# Patient Record
Sex: Male | Born: 1955 | Race: Black or African American | Hispanic: No | Marital: Single | Smoking: Current every day smoker
Health system: Southern US, Community
[De-identification: ages and names within clinical notes are randomized; demographics above are authoritative.]

## PROBLEM LIST (undated history)

## (undated) DIAGNOSIS — I10 Essential (primary) hypertension: Secondary | ICD-10-CM

## (undated) HISTORY — DX: Essential (primary) hypertension: I10

---

## 2012-01-29 ENCOUNTER — Emergency Department: Payer: Self-pay | Admitting: Internal Medicine

## 2012-01-29 LAB — BASIC METABOLIC PANEL
Anion Gap: 8 (ref 7–16)
BUN: 6 mg/dL — ABNORMAL LOW (ref 7–18)
Calcium, Total: 9.4 mg/dL (ref 8.5–10.1)
Chloride: 106 mmol/L (ref 98–107)
Co2: 26 mmol/L (ref 21–32)
Glucose: 88 mg/dL (ref 65–99)
Osmolality: 276 (ref 275–301)
Potassium: 3.5 mmol/L (ref 3.5–5.1)
Sodium: 140 mmol/L (ref 136–145)

## 2012-01-29 LAB — CBC WITH DIFFERENTIAL/PLATELET
Basophil #: 0 10*3/uL (ref 0.0–0.1)
Basophil %: 0.2 %
Eosinophil %: 0.2 %
HCT: 49.8 % (ref 40.0–52.0)
Lymphocyte %: 9.9 %
MCH: 30.5 pg (ref 26.0–34.0)
MCV: 88 fL (ref 80–100)
Monocyte #: 1.4 x10 3/mm — ABNORMAL HIGH (ref 0.2–1.0)
Monocyte %: 9.8 %
Neutrophil #: 11.2 10*3/uL — ABNORMAL HIGH (ref 1.4–6.5)
Neutrophil %: 79.9 %
Platelet: 220 10*3/uL (ref 150–440)
RBC: 5.68 10*6/uL (ref 4.40–5.90)
RDW: 13.4 % (ref 11.5–14.5)
WBC: 14 10*3/uL — ABNORMAL HIGH (ref 3.8–10.6)

## 2013-01-01 IMAGING — CT CT NECK WITH CONTRAST
1 of 2 series · 9 of 14 positions shown, 12 images · IV contrast (agent unspecified)
Comparison: none

REASON FOR EXAM: dysphagia, pain
COMMENTS:

PROCEDURE:     CT  - CT NECK WITH CONTRAST  - January 29, 2012  [DATE]
RESULT:     Comparison: None.
TECHNIQUE: Multiple axial images were obtained of the neck from the frontal
sinuses through to the aortic arch after the administration of 75 mL
[id number] intravenous contrast.

[Series 2: soft tissue · axial · 0.47mm/px · z∈[+156,+408]mm · 9 of 106 slices shown, 12 images]
[im 11/106  soft-tissue]
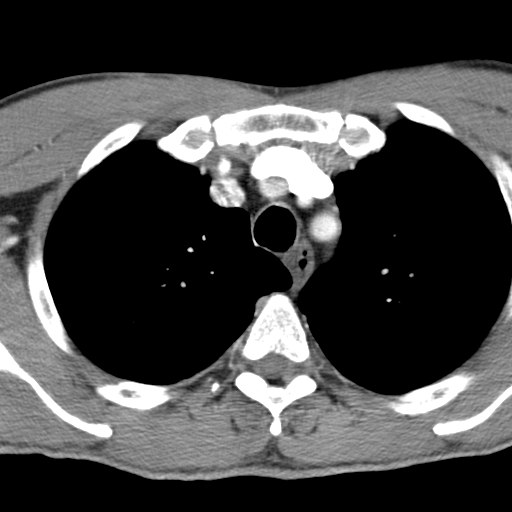
[im 11/106  bone]
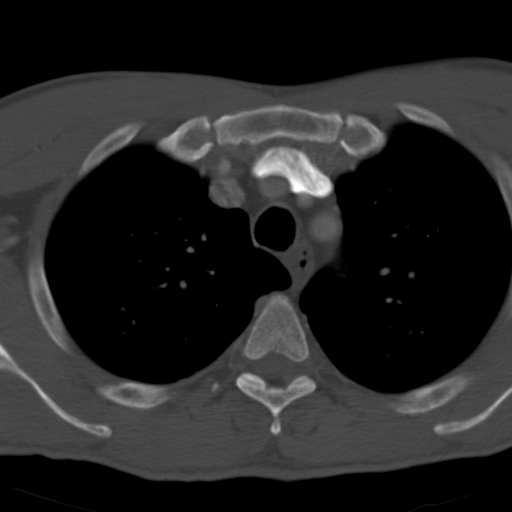
[im 22/106  bone]
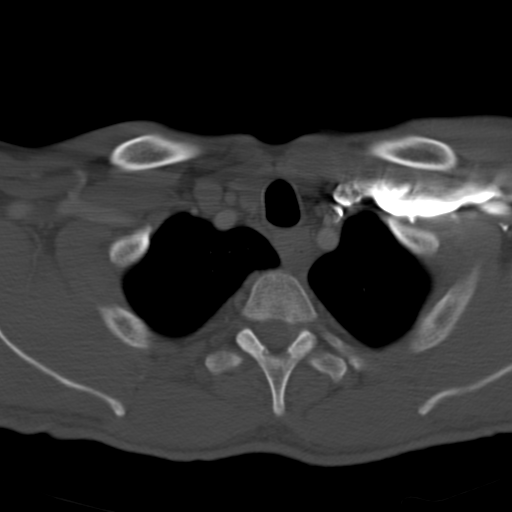
[im 32/106  bone]
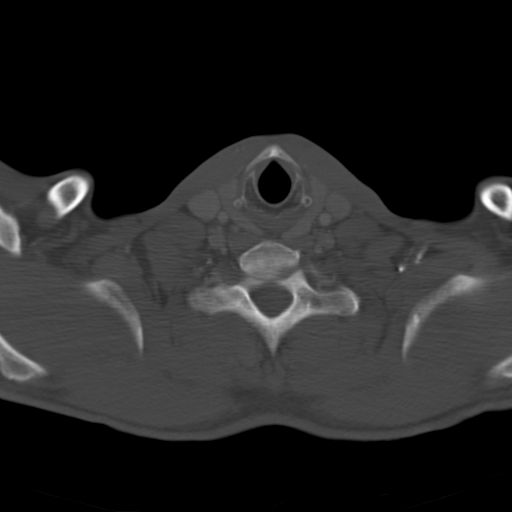
[im 43/106  bone]
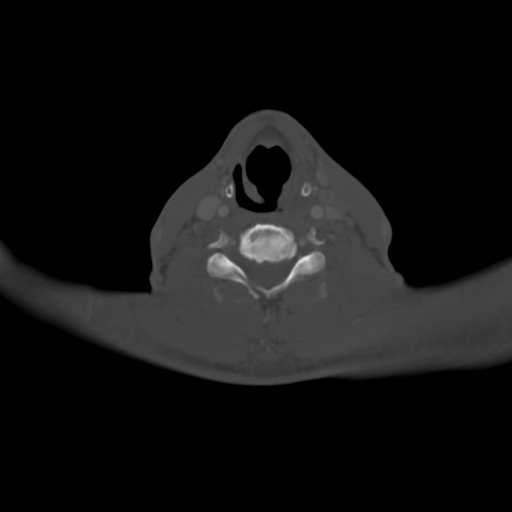
[im 53/106  soft-tissue]
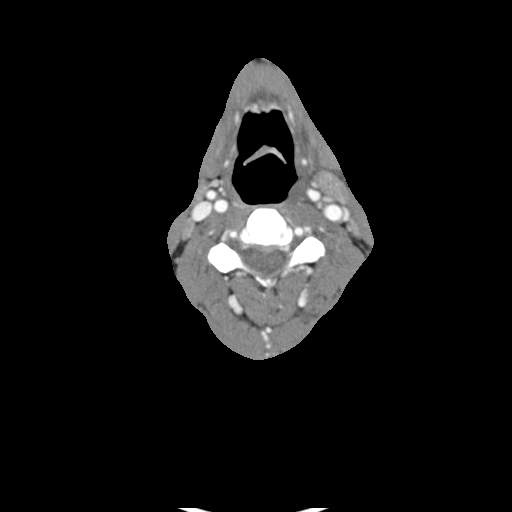
[im 53/106  bone]
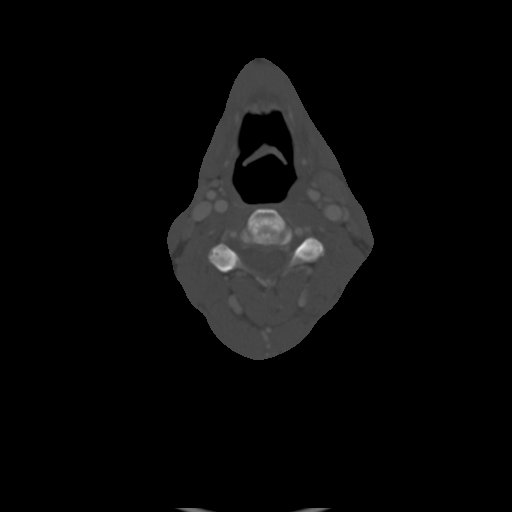
[im 64/106  bone]
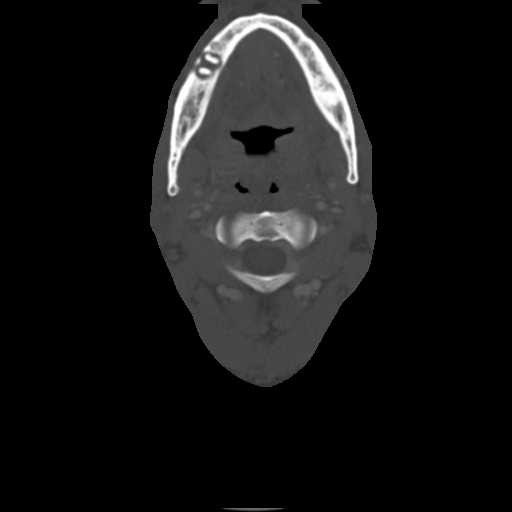
[im 74/106  bone]
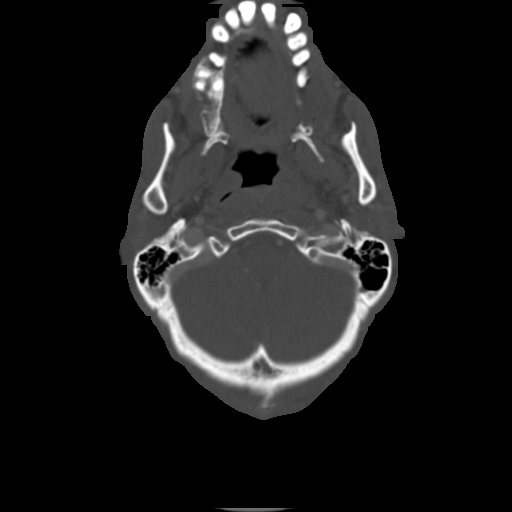
[im 85/106  bone]
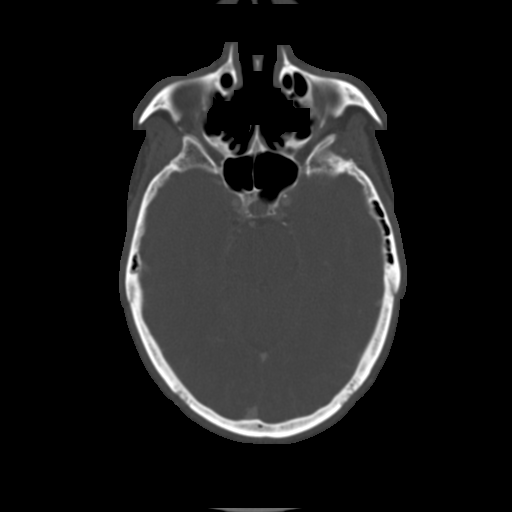
[im 95/106  soft-tissue]
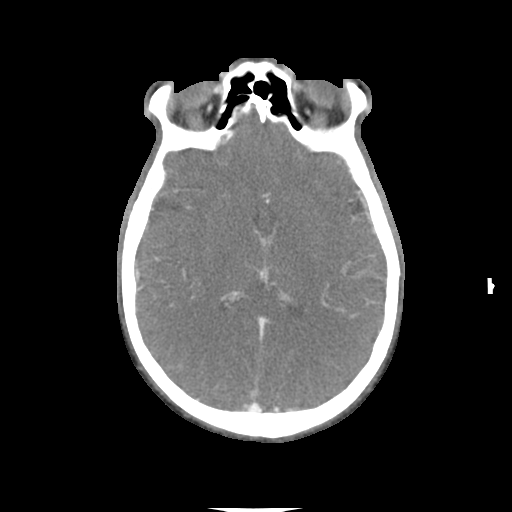
[im 95/106  bone]
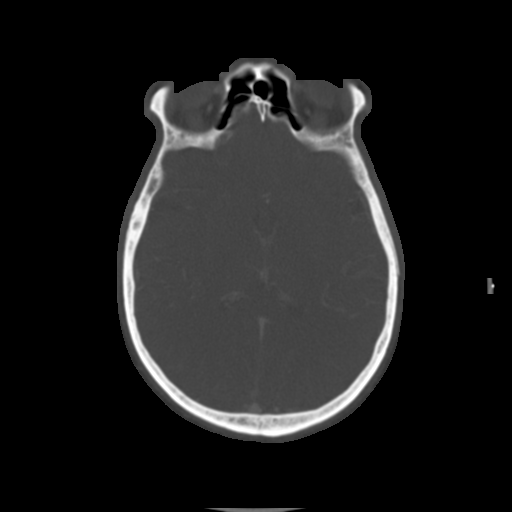

[9 of 14 positions shown; findings below may reference images not displayed]

FINDINGS: There is enlargement of the left tonsillar pillar with ill-defined areas of
low-attenuation within the tonsillar pillar. Exact measurement of the area
of low-attenuation is difficult to determine, but measures approximately
x 1.0 cm. There is a nodular enhancing soft tissue along the base of the
tongue both at midline as well as to the right and left of midline. This is
of uncertain etiology, and malignancy cannot be excluded. There is an
enlarged left carotid chain lymph node at the level of the submandibular
gland which measures 1.8 x 1.1 cm. There are several other borderline
enlarged carotid chain lymph nodes, bilaterally. A right level 2 carotid
chain lymph node measures 1.2 x 1.0 cm. There is mild thickening of the left
aryepiglottic folds, which is nonspecific. The airway is patent.

There is mild circumferential thickening of the visualized upper thoracic
esophagus, which is nonspecific. There is periapical lucency surrounding
multiple teeth in the maxilla as well as the right mandible, likely
secondary to periodontal disease.

No aggressive lytic or sclerotic osseous lesions are identified.
IMPRESSION: 1. Enlarged and heterogeneous left tonsillar pillar is nonspecific and may
be infectious. There is ill-defined area of low-attenuation within the
tonsillar pillar which may represent phlegmonous change versus early
abscess. While these findings may be infectious, malignancy cannot be
excluded. Clinical correlation and direct visualization are suggested.
2. Nodular enhancing soft tissue at the base of the tongue is nonspecific.
Malignancy malignancy cannot be excluded. Direct visualization is
recommended.
3. Mildly enlarged bilateral carotid chain lymph nodes, which are
nonspecific.
4. Mild thickening of the left aryepiglottic folds, which is not
significantly be infectious or inflammatory. Recommend attention on the
above direct visualization.
5. Mild circumferential thickening of the superior thoracic esophagus is
nonspecific and may be related to esophagitis. Further evaluation could be
provided with direct visualization as indicated.

The preliminary report was provided to the emergency department shortly
after the procedure. The final report was called to Dr. Purna Hoops at 1671
hours 01/30/2012.

## 2015-01-11 NOTE — Consult Note (Signed)
PATIENT NAME:  Rodney English, Rodney English MR#:  [id number] DATE OF BIRTH:  09-Apr-1956  DATE OF CONSULTATION:  01/29/2012  REFERRING PHYSICIAN:   CONSULTING PHYSICIAN:  Lubertha Leite A. Allena KatzPatel, MD  PRIMARY CARE PHYSICIAN: None.  CHIEF COMPLAINT: Left neck pain for three days.   HISTORY OF PRESENT ILLNESS: Mr. Rodney English is a pleasant 59 year old African American gentleman with no significant past medical history who comes to the emergency room with complaints of left neck pain for the past three days. He started having some symptoms on Thursday, got worse, and had his last meal on Saturday night and had pizza which he was  able to swallow it. This morning he woke up and was having a little bit more difficulty swallowing and came to the emergency room and was found to have left-sided tonsillitis. CT of the neck was done which showed mild to moderate enlargement of the left tonsillar pillar of the tonsil with possible early abscess. The patient in the emergency room is hemodynamically stable. He is afebrile. He is not in any respiratory distress. He did have some odynophagia, however, he was able to tolerate liquids and did not have any symptoms of respiratory distress. His white count is elevated to 14,000. He is not having any fever. The patient did drink some liquids in front of me and did not have any trouble swallowing. He received a dose of Rocephin, Unasyn, and 10 mg of IV Decadron in the emergency room. He also received one dose of Toradol. The patient feels much better than when he came in.  PAST MEDICAL HISTORY: None.   PAST SURGICAL HISTORY: None.   MEDICATIONS: None.   ALLERGIES: No known drug allergies.   SOCIAL HISTORY: He drinks 1 or 2 beers every day. He denies any alcohol related problems. He smokes a pack a day.   REVIEW OF SYSTEMS: CONSTITUTIONAL: No fever, fatigue, or weakness. EYES: No blurred or double vision. ENT: Positive for odynophagia and left neck pain. RESPIRATORY: No cough, wheeze, or  hemoptysis. CARDIOVASCULAR: No chest pain, orthopnea, or edema. GASTROINTESTINAL: No nausea, vomiting, diarrhea, or abdominal pain. GU: No dysuria or hematuria. ENDOCRINE: No polyuria or nocturia. HEMATOLOGY: No anemia or easy bruising. SKIN: No acne or rash. MUSCULOSKELETAL: Positive for arthritis. NEUROLOGIC: No cerebrovascular accident or transient ischemic attack. PSYCH: No anxiety or depression. All other systems reviewed are negative.   PHYSICAL EXAMINATION:   GENERAL: The patient is awake, alert, and oriented x3, not in acute distress.   VITAL SIGNS: He is afebrile. Pulse is 67 and blood pressure 117/64.  Saturations are 100% on room air.  HEENT: Atraumatic, normocephalic. Pupils are equal, round, and reactive to light and accommodation. Extraocular movements intact. Oral mucosa is moist. Oropharynx - the patient does have erythema on the left tonsillar area with uvula visible. No swelling or erythema of the uvula and no swelling or erythema of the right tonsillar area. I could not see any pus pockets or any exudative spots on the left tonsil. I could not feel any anterior cervical chain lymph nodes at present.   RESPIRATORY: Clear to auscultation bilaterally. No rales, rhonchi, respiratory distress, or labored breathing.   HEART: Both heart sounds are normal. Rate and rhythm is regular. PMI is not lateralized. Chest is nontender.   EXTREMITIES: Good pedal pulses. Good femoral pulses. No lower extremity edema.   ABDOMEN: Soft, benign, and nontender. No organomegaly. Positive bowel sounds.   NEURO: Cranial nerves II through XII grossly intact. No motor or sensory deficits.  PSYCH: The patient is awake, alert, and oriented x3.   LABS/STUDIES: White count 14.0 and hemoglobin and hematocrit is within normal limits. Basic metabolic panel is within normal limits.   CT of the neck shows mild to moderate enlargement of the left tonsillar pillar with submucosal 8 mm hypo-enhancing region  suggestive of possibly early abscess. No evidence of epiglottitis.   ASSESSMENT: 59 year old Rodney English with: 1. Left side tonsillitis. Symptoms started on Thursday. The patient is able to swallow liquids without any difficulty. No respiratory distress. Saturations are 100% on room air. CT of the neck is suggestive of mild to moderate inflammation of the left tonsil with 8 mm hypo-enhancing area suggestive of early abscess. No epiglottitis.  2. Odynophagia due to tonsillitis.  3. Tobacco abuse.   PLAN: The plan was discussed with Dr. Gertie Baron. The area appears intact, able to swallow liquids, no respiratory distress. He feels better than with what he came in with. He received a dose of antibiotic and Decadron along with pain medication. Okay to go home. The patient will be discharged to go home, have a close follow-up with Dr. Gertie Baron. The patient was given the telephone number to make an appointment in the morning. He is recommended to continue p.o. Augmentin and Decadron. The patient is asked to take plenty of liquids and a soft diet. He is also recommended to come to the emergency room if no improvement. The patient is agreeable. The discharge plan was discussed with the patient. No family members present. The above was also discussed with Dr. Gertie Baron who is agreeable.   TIME SPENT: 45 minutes  ____________________________ Jearl Klinefelter A. Allena Katz, MD sap:slb D: 01/29/2012 23:21:49 ET T: 01/30/2012 09:34:08 ET JOB#: [id number]  cc: Herley Bernardini A. Allena Katz, MD, <Dictator> Willow Ora MD ELECTRONICALLY SIGNED 01/30/2012 11:03

## 2022-11-11 ENCOUNTER — Other Ambulatory Visit: Payer: Self-pay | Admitting: Internal Medicine

## 2022-11-11 DIAGNOSIS — F17210 Nicotine dependence, cigarettes, uncomplicated: Secondary | ICD-10-CM

## 2022-11-11 DIAGNOSIS — Z136 Encounter for screening for cardiovascular disorders: Secondary | ICD-10-CM

## 2022-11-16 ENCOUNTER — Other Ambulatory Visit: Payer: Self-pay | Admitting: Internal Medicine

## 2022-11-16 DIAGNOSIS — Z136 Encounter for screening for cardiovascular disorders: Secondary | ICD-10-CM

## 2022-11-16 DIAGNOSIS — F17210 Nicotine dependence, cigarettes, uncomplicated: Secondary | ICD-10-CM

## 2022-12-13 ENCOUNTER — Ambulatory Visit: Payer: Medicare HMO

## 2022-12-23 ENCOUNTER — Ambulatory Visit: Payer: Medicaid Other

## 2023-03-06 ENCOUNTER — Other Ambulatory Visit: Payer: Self-pay | Admitting: Internal Medicine

## 2023-03-06 DIAGNOSIS — R220 Localized swelling, mass and lump, head: Secondary | ICD-10-CM

## 2024-03-11 ENCOUNTER — Emergency Department
Admission: EM | Admit: 2024-03-11 | Discharge: 2024-03-11 | Disposition: A | Discharge status: Home / Self Care | Attending: Emergency Medicine | Admitting: Emergency Medicine

## 2024-03-11 ENCOUNTER — Other Ambulatory Visit: Payer: Self-pay

## 2024-03-11 ENCOUNTER — Telehealth: Payer: Self-pay | Admitting: Oncology

## 2024-03-11 DIAGNOSIS — D509 Iron deficiency anemia, unspecified: Secondary | ICD-10-CM

## 2024-03-11 DIAGNOSIS — R42 Dizziness and giddiness: Secondary | ICD-10-CM | POA: Diagnosis present

## 2024-03-11 LAB — CBC WITH DIFFERENTIAL/PLATELET
Abs Immature Granulocytes: 0.01 10*3/uL (ref 0.00–0.07)
Basophils Absolute: 0.1 10*3/uL (ref 0.0–0.1)
Basophils Relative: 1 %
Eosinophils Absolute: 0.2 10*3/uL (ref 0.0–0.5)
Eosinophils Relative: 3 %
HCT: 24.8 % — ABNORMAL LOW (ref 39.0–52.0)
Hemoglobin: 6.4 g/dL — ABNORMAL LOW (ref 13.0–17.0)
Immature Granulocytes: 0 %
Lymphocytes Relative: 29 %
Lymphs Abs: 1.7 10*3/uL (ref 0.7–4.0)
MCH: 15.6 pg — ABNORMAL LOW (ref 26.0–34.0)
MCHC: 25.8 g/dL — ABNORMAL LOW (ref 30.0–36.0)
MCV: 60.6 fL — ABNORMAL LOW (ref 80.0–100.0)
Monocytes Absolute: 0.5 10*3/uL (ref 0.1–1.0)
Monocytes Relative: 9 %
Neutro Abs: 3.3 10*3/uL (ref 1.7–7.7)
Neutrophils Relative %: 58 %
Platelets: 284 10*3/uL (ref 150–400)
RBC: 4.09 MIL/uL — ABNORMAL LOW (ref 4.22–5.81)
RDW: 21.3 % — ABNORMAL HIGH (ref 11.5–15.5)
Smear Review: NORMAL
WBC: 5.8 10*3/uL (ref 4.0–10.5)
nRBC: 0 % (ref 0.0–0.2)

## 2024-03-11 LAB — FERRITIN: Ferritin: 3 ng/mL — ABNORMAL LOW (ref 24–336)

## 2024-03-11 LAB — IRON AND TIBC
Iron: 12 ug/dL — ABNORMAL LOW (ref 45–182)
Saturation Ratios: 3 % — ABNORMAL LOW (ref 17.9–39.5)
TIBC: 483 ug/dL — ABNORMAL HIGH (ref 250–450)
UIBC: 471 ug/dL

## 2024-03-11 LAB — BASIC METABOLIC PANEL WITH GFR
Anion gap: 4 — ABNORMAL LOW (ref 5–15)
BUN: 9 mg/dL (ref 8–23)
CO2: 22 mmol/L (ref 22–32)
Calcium: 8.6 mg/dL — ABNORMAL LOW (ref 8.9–10.3)
Chloride: 112 mmol/L — ABNORMAL HIGH (ref 98–111)
Creatinine, Ser: 0.92 mg/dL (ref 0.61–1.24)
GFR, Estimated: >60 mL/min (ref >60)
Glucose, Bld: 86 mg/dL (ref 70–99)
Potassium: 3.8 mmol/L (ref 3.5–5.1)
Sodium: 138 mmol/L (ref 135–145)

## 2024-03-11 LAB — ABO/RH: ABO/RH(D): B POS

## 2024-03-11 LAB — PREPARE RBC (CROSSMATCH)

## 2024-03-11 MED ORDER — VITAMIN C 250 MG PO TABS: 250.0000 mg | ORAL_TABLET | Freq: Every day | ORAL | 2 refills | Status: AC

## 2024-03-11 MED ORDER — FERROUS SULFATE 325 (65 FE) MG PO TBEC: 325.0000 mg | DELAYED_RELEASE_TABLET | Freq: Two times a day (BID) | ORAL | 3 refills | Status: DC

## 2024-03-11 NOTE — Discharge Instructions (Addendum)
 Your blood work today showed severe iron deficiency anemia and you will need to follow-up with a hematologist for this.  The phone number is located on your paperwork and you will need to call this number to schedule an appointment for later this week.  In the meantime, you should take iron and vitamin C as prescribed.  Please return to the ER for any bleeding, worsening lightheadedness, chest pain, shortness of breath, or any other concerning symptoms.  You will also need to schedule an appointment for a colonoscopy either through your PCP or a gastroenterologist.  Phone number was provided for gastroenterologist in this paperwork.

## 2024-03-11 NOTE — ED Triage Notes (Signed)
 Pt referred to ED by MD for low hemoglobin of 6.9. Blood work completed one week ago.

## 2024-03-11 NOTE — ED Notes (Signed)
 Electronic blood consent signed at this time.

## 2024-03-11 NOTE — ED Provider Notes (Signed)
 [facility] Provider Note    Event Date/Time   First MD Initiated Contact with Patient 03/11/24 [phone]     (approximate)   History   Chief Complaint abnormal labs   HPI  Reginaldo Hazard is a 68 y.o. male with no reported past medical history who presents to the ED for abnormal labs.  Patient reports that he had blood drawn 1 week ago at routine follow-up, subsequently received a phone call that his blood counts were very low and he needed to come to the hospital.  He states that he had a small amount of blood in his stool about a month ago that resolved after 24 hours, has not noticed any other bleeding since then and denies any black tarry stool.  He denies any history of anemia requiring blood transfusion, does have paperwork with him from his PCPs office stating hemoglobin was 8.1 in January, then 6.9 earlier this week.  He reports some mild lightheadedness but otherwise denies any chest pain, shortness of breath, or any other symptoms.     Physical Exam   Triage Vital Signs: ED Triage Vitals  Encounter Vitals Group     BP 03/11/24 0904 (!) 142/67     Girls Systolic BP Percentile --      Girls Diastolic BP Percentile --      Boys Systolic BP Percentile --      Boys Diastolic BP Percentile --      Pulse Rate 03/11/24 0904 97     Resp 03/11/24 0904 18     Temp 03/11/24 0910 98.9 F (37.2 C)     Temp Source 03/11/24 0910 Oral     SpO2 03/11/24 0904 100 %     Weight 03/11/24 0912 155 lb (70.3 kg)     Height 03/11/24 0912 5' 7 (1.702 m)     Head Circumference --      Peak Flow --      Pain Score 03/11/24 0908 0     Pain Loc --      Pain Education --      Exclude from Growth Chart --     Most recent vital signs: Vitals:   03/11/24 1343 03/11/24 1456  BP: 132/66 (!) 140/82  Pulse: 69 70  Resp: 14 16  Temp: 98.8 F (37.1 C) 99.1 F (37.3 C)  SpO2: 100% 100%    Constitutional: Alert and oriented. Eyes: Conjunctivae are normal. Head:  Atraumatic. Nose: No congestion/rhinnorhea. Mouth/Throat: Mucous membranes are moist.  Cardiovascular: Normal rate, regular rhythm. Grossly normal heart sounds.  2+ radial pulses bilaterally. Respiratory: Normal respiratory effort.  No retractions. Lungs CTAB. Gastrointestinal: Soft and nontender. No distention. Musculoskeletal: No lower extremity tenderness nor edema.  Neurologic:  Normal speech and language. No gross focal neurologic deficits are appreciated.    ED Results / Procedures / Treatments   Labs (all labs ordered are listed, but only abnormal results are displayed) Labs Reviewed  CBC WITH DIFFERENTIAL/PLATELET - Abnormal; Notable for the following components:      Result Value   RBC 4.09 (*)    Hemoglobin 6.4 (*)    HCT 24.8 (*)    MCV 60.6 (*)    MCH 15.6 (*)    MCHC 25.8 (*)    RDW 21.3 (*)    All other components within normal limits  FERRITIN - Abnormal; Notable for the following components:   Ferritin 3 (*)    All other components within normal limits  IRON AND  TIBC - Abnormal; Notable for the following components:   Iron 12 (*)    TIBC 483 (*)    Saturation Ratios 3 (*)    All other components within normal limits  BASIC METABOLIC PANEL WITH GFR - Abnormal; Notable for the following components:   Chloride 112 (*)    Calcium 8.6 (*)    Anion gap 4 (*)    All other components within normal limits  TYPE AND SCREEN  PREPARE RBC (CROSSMATCH)  ABO/RH    PROCEDURES:  Critical Care performed: Yes, see critical care procedure note(s)  .Critical Care  Performed by: Willo Dunnings, MD Authorized by: Willo Dunnings, MD   Critical care provider statement:    Critical care time (minutes):  30   Critical care time was exclusive of:  Separately billable procedures and treating other patients and teaching time   Critical care was necessary to treat or prevent imminent or life-threatening deterioration of the following conditions: Anemia.   Critical care was  time spent personally by me on the following activities:  Development of treatment plan with patient or surrogate, discussions with consultants, evaluation of patient's response to treatment, examination of patient, ordering and review of laboratory studies, ordering and review of radiographic studies, ordering and performing treatments and interventions, pulse oximetry, re-evaluation of patient's condition and review of old charts   I assumed direction of critical care for this patient from another provider in my specialty: no      MEDICATIONS ORDERED IN ED: Medications - No data to display   IMPRESSION / MDM / ASSESSMENT AND PLAN / ED COURSE  I reviewed the triage vital signs and the nursing notes.                              68 y.o. male with no significant past medical history presents to the ED with abnormal labs and hemoglobin of 6.9 on lab work drawn 1 week ago.  Patient's presentation is most consistent with acute presentation with potential threat to life or bodily function.  Differential diagnosis includes, but is not limited to, anemia, GI bleed, iron deficiency anemia, anemia of chronic disease, electrolyte abnormality, AKI.  Patient nontoxic-appearing and in no acute distress, vital signs are unremarkable.  He states he had some mild bleeding 1 month ago but none recently, has a benign abdominal exam.  We will recheck his hemoglobin, but paperwork brought from home shows gradually downtrending hemoglobin from last year with additional testing consistent with iron deficiency.  We will check ferritin and TIBC levels for signs of iron deficiency given no evidence of active bleeding at this time.  Labs show significant anemia with hemoglobin of 6.4, no significant leukocytosis noted and this is a microcytic anemia consistent with iron deficiency.  His iron and ferritin levels are very low, TIBC elevated consistent with iron deficiency.  Findings reviewed with Dr. Babara of hematology, who  agrees with plan for outpatient follow-up later this week following transfusion in the ED.  No significant electrolyte abnormality or AKI noted.  Transfusion completed and patient feeling well, counseled to follow-up as an outpatient with hematology as well as with GI for colonoscopy.  He was counseled to return to the ED for new or worsening symptoms, patient agrees with plan.      FINAL CLINICAL IMPRESSION(S) / ED DIAGNOSES   Final diagnoses:  Iron deficiency anemia, unspecified iron deficiency anemia type     Rx /  DC Orders   ED Discharge Orders          Ordered    ferrous sulfate 325 (65 FE) MG EC tablet  2 times daily        03/11/24 1454    vitamin C (ASCORBIC ACID) 250 MG tablet  Daily        03/11/24 1454             Note:  This document was prepared using Dragon voice recognition software and may include unintentional dictation errors.   Willo Dunnings, MD 03/11/24 [phone]

## 2024-03-11 NOTE — Telephone Encounter (Signed)
 VM left with patient in regards to scheduling consult and infusions per secure chat between Dr. Babara and Dr. Willo.

## 2024-03-12 LAB — BPAM RBC
Blood Product Expiration Date: 202507192359
ISSUE DATE / TIME: 202506231314
Unit Type and Rh: 1700

## 2024-03-12 LAB — TYPE AND SCREEN
ABO/RH(D): B POS
Antibody Screen: NEGATIVE
Unit division: 0

## 2024-03-14 ENCOUNTER — Inpatient Hospital Stay (HOSPITAL_BASED_OUTPATIENT_CLINIC_OR_DEPARTMENT_OTHER): Admitting: Oncology

## 2024-03-14 ENCOUNTER — Encounter: Payer: Self-pay | Admitting: Oncology

## 2024-03-14 ENCOUNTER — Inpatient Hospital Stay: Attending: Oncology

## 2024-03-14 ENCOUNTER — Inpatient Hospital Stay

## 2024-03-14 VITALS — BP 120/66 | HR 83 | Resp 17

## 2024-03-14 VITALS — BP 127/71 | HR 91 | Temp 96.6°F | Resp 18 | Wt 156.3 lb

## 2024-03-14 DIAGNOSIS — D500 Iron deficiency anemia secondary to blood loss (chronic): Secondary | ICD-10-CM

## 2024-03-14 DIAGNOSIS — D509 Iron deficiency anemia, unspecified: Secondary | ICD-10-CM | POA: Diagnosis present

## 2024-03-14 DIAGNOSIS — F17210 Nicotine dependence, cigarettes, uncomplicated: Secondary | ICD-10-CM | POA: Diagnosis not present

## 2024-03-14 DIAGNOSIS — Z803 Family history of malignant neoplasm of breast: Secondary | ICD-10-CM | POA: Diagnosis not present

## 2024-03-14 DIAGNOSIS — Z800 Family history of malignant neoplasm of digestive organs: Secondary | ICD-10-CM | POA: Diagnosis not present

## 2024-03-14 LAB — CBC (CANCER CENTER ONLY)
HCT: 30.7 % — ABNORMAL LOW (ref 39.0–52.0)
Hemoglobin: 8.2 g/dL — ABNORMAL LOW (ref 13.0–17.0)
MCH: 17 pg — ABNORMAL LOW (ref 26.0–34.0)
MCHC: 26.7 g/dL — ABNORMAL LOW (ref 30.0–36.0)
MCV: 63.8 fL — ABNORMAL LOW (ref 80.0–100.0)
Platelet Count: 338 10*3/uL (ref 150–400)
RBC: 4.81 MIL/uL (ref 4.22–5.81)
RDW: 26 % — ABNORMAL HIGH (ref 11.5–15.5)
WBC Count: 8.6 10*3/uL (ref 4.0–10.5)
nRBC: 0 % (ref 0.0–0.2)

## 2024-03-14 LAB — SAMPLE TO BLOOD BANK

## 2024-03-14 MED ORDER — SODIUM CHLORIDE 0.9% FLUSH
10.0000 mL | Freq: Once | INTRAVENOUS | Status: AC | PRN
  Administered 2024-03-14: 10 mL
  Filled 2024-03-14: qty 10

## 2024-03-14 MED ORDER — IRON SUCROSE 20 MG/ML IV SOLN
200.0000 mg | Freq: Once | INTRAVENOUS | Status: AC
  Administered 2024-03-14: 200 mg via INTRAVENOUS
  Filled 2024-03-14: qty 10

## 2024-03-14 NOTE — Progress Notes (Signed)
 Hematology/Oncology Consult note [phone] [phone]        REFERRING PROVIDER: Willo Dunnings, MD   CHIEF COMPLAINTS/REASON FOR VISIT:  Evaluation of iron deficiency anemia.    ASSESSMENT & PLAN:   Iron deficiency anemia Labs are reviewed and discussed with patient. Lab Results  Component Value Date   HGB 8.2 (L) 03/14/2024   TIBC 483 (H) 03/11/2024   IRONPCTSAT 3 (L) 03/11/2024   FERRITIN 3 (L) 03/11/2024    I discussed about IV Venofer treatments. I discussed about the potential risks including but not limited to allergic reactions/infusion reactions including anaphylactic reactions, diarrhea, phlebitis, high blood pressure, wheezing, SOB, skin rash, weight gain,dark urine, leg swelling, back pain, headache, nausea and fatigue, etc. He agrees with the plan.  Plan IV venofer weekly x 5  Etiology of iron deficiency anemia, suspect GI blood loss. He has upcoming appointment with Dr. Unk    No orders of the defined types were placed in this encounter.   All questions were answered. The patient knows to call the clinic with any problems, questions or concerns.  Zelphia Cap, MD, PhD [facility] Hematology Oncology 03/14/2024   HISTORY OF PRESENTING ILLNESS:   Rodney English is a  68 y.o.  male with PMH listed below was seen in consultation at the request of  Willo Dunnings, MD  for evaluation of iron deficiency anemia.   He has experienced fatigue and tiredness for over six months. A recent wellness check revealed Hb f 6.4, leading to ER visit and a blood transfusion on Monday. His blood work is consistent with iron deficiency.  No recent blood in the stool, but he has noticed it in the past. He denies changes in bowel habits, unintentional weight loss, constipation, diarrhea, or stomach pain. His appetite remains good.  He is currently taking iron pills and vitamin C to aid absorption.  His family history is significant for cancer, including a  niece who died from colon cancer, a brother who died from pancreatic cancer, and a sister who had breast cancer.  Socially, he drinks beer on weekends, consuming four to five beers, and has a long history of smoking, currently smoking half a pack a day for about fifty-one years.   MEDICAL HISTORY:  Past Medical History:  Diagnosis Date   HTN (hypertension)     SURGICAL HISTORY: History reviewed. No pertinent surgical history.  SOCIAL HISTORY: Social History   Socioeconomic History   Marital status: Single    Spouse name: Not on file   Number of children: Not on file   Years of education: Not on file   Highest education level: Not on file  Occupational History   Not on file  Tobacco Use   Smoking status: Every Day    Types: Cigarettes   Smokeless tobacco: Never  Substance and Sexual Activity   Alcohol use: Yes   Drug use: Never   Sexual activity: Not on file  Other Topics Concern   Not on file  Social History Narrative   Not on file   Social Drivers of Health   Financial Resource Strain: Not on file  Food Insecurity: Not on file  Transportation Needs: Not on file  Physical Activity: Not on file  Stress: Not on file  Social Connections: Not on file  Intimate Partner Violence: Not on file    FAMILY HISTORY: Family History  Problem Relation Age of Onset   Cancer Sister    Cancer Brother     ALLERGIES:  has no known allergies.  MEDICATIONS:  Current Outpatient Medications  Medication Sig Dispense Refill   acetaminophen (TYLENOL) 500 MG tablet 1 tablet Orally every 6 hrs for 30 days As needed for pain     atorvastatin (LIPITOR) 40 MG tablet 1 tablet Orally Once a day for 90 days For high cholesterol/plaque build up in arteries     ferrous sulfate 325 (65 FE) MG EC tablet Take 1 tablet (325 mg total) by mouth 2 (two) times daily. 60 tablet 3   losartan (COZAAR) 25 MG tablet 1 tablet Orally Once a day for 100 days for high blood pressure     pantoprazole  (PROTONIX) 40 MG tablet 1 tablet Orally Once a day for 100 days At least 30 minutes before morning meal. For acid reflux.     vitamin C (ASCORBIC ACID) 250 MG tablet Take 1 tablet (250 mg total) by mouth daily. 30 tablet 2   No current facility-administered medications for this visit.    Review of Systems  Constitutional:  Positive for fatigue. Negative for appetite change, chills, fever and unexpected weight change.  HENT:   Negative for hearing loss and voice change.   Eyes:  Negative for eye problems and icterus.  Respiratory:  Negative for chest tightness, cough and shortness of breath.   Cardiovascular:  Negative for chest pain and leg swelling.  Gastrointestinal:  Negative for abdominal distention and abdominal pain.  Endocrine: Negative for hot flashes.  Genitourinary:  Negative for difficulty urinating, dysuria and frequency.   Musculoskeletal:  Negative for arthralgias.  Skin:  Negative for itching and rash.  Neurological:  Negative for light-headedness and numbness.  Hematological:  Negative for adenopathy. Does not bruise/bleed easily.  Psychiatric/Behavioral:  Negative for confusion.    PHYSICAL EXAMINATION: ECOG PERFORMANCE STATUS: 1 - Symptomatic but completely ambulatory Vitals:   03/14/24 0922  BP: 127/71  Pulse: 91  Resp: 18  Temp: (!) 96.6 F (35.9 C)  SpO2: 100%   Filed Weights   03/14/24 0922  Weight: 156 lb 4.8 oz (70.9 kg)    Physical Exam Constitutional:      General: He is not in acute distress. HENT:     Head: Normocephalic and atraumatic.   Eyes:     General: No scleral icterus.   Cardiovascular:     Rate and Rhythm: Normal rate and regular rhythm.     Heart sounds: Normal heart sounds.  Pulmonary:     Effort: Pulmonary effort is normal. No respiratory distress.     Breath sounds: No wheezing.  Abdominal:     General: Bowel sounds are normal. There is no distension.     Palpations: Abdomen is soft.   Musculoskeletal:        General:  No deformity. Normal range of motion.     Cervical back: Normal range of motion and neck supple.   Skin:    General: Skin is warm and dry.     Findings: No erythema or rash.   Neurological:     Mental Status: He is alert and oriented to person, place, and time. Mental status is at baseline.     Cranial Nerves: No cranial nerve deficit.     Coordination: Coordination normal.   Psychiatric:        Mood and Affect: Mood normal.     LABORATORY DATA:  I have reviewed the data as listed    Latest Ref Rng & Units 03/11/2024   10:20 AM 01/29/2012    6:27  PM  CBC  WBC 4.0 - 10.5 K/uL 5.8  14.0   Hemoglobin 13.0 - 17.0 g/dL 6.4  82.6   Hematocrit 39.0 - 52.0 % 24.8  49.8   Platelets 150 - 400 K/uL 284  220       Latest Ref Rng & Units 03/11/2024    9:05 AM 01/29/2012    6:27 PM  CMP  Glucose 70 - 99 mg/dL 86  88   BUN 8 - 23 mg/dL 9  6   Creatinine 9.38 - 1.24 mg/dL 9.07  9.25   Sodium 864 - 145 mmol/L 138  140   Potassium 3.5 - 5.1 mmol/L 3.8  3.5   Chloride 98 - 111 mmol/L 112  106   CO2 22 - 32 mmol/L 22  26   Calcium 8.9 - 10.3 mg/dL 8.6  9.4       RADIOGRAPHIC STUDIES: I have personally reviewed the radiological images as listed and agreed with the findings in the report. No results found.

## 2024-03-14 NOTE — Assessment & Plan Note (Addendum)
 Labs are reviewed and discussed with patient. Lab Results  Component Value Date   HGB 8.2 (L) 03/14/2024   TIBC 483 (H) 03/11/2024   IRONPCTSAT 3 (L) 03/11/2024   FERRITIN 3 (L) 03/11/2024    I discussed about IV Venofer treatments. I discussed about the potential risks including but not limited to allergic reactions/infusion reactions including anaphylactic reactions, diarrhea, phlebitis, high blood pressure, wheezing, SOB, skin rash, weight gain,dark urine, leg swelling, back pain, headache, nausea and fatigue, etc. He agrees with the plan.  Plan IV venofer weekly x 5  Etiology of iron deficiency anemia, suspect GI blood loss. He has upcoming appointment with Dr. Unk

## 2024-03-15 ENCOUNTER — Inpatient Hospital Stay

## 2024-03-15 ENCOUNTER — Inpatient Hospital Stay: Admitting: Oncology

## 2024-03-20 ENCOUNTER — Inpatient Hospital Stay: Attending: Oncology

## 2024-03-20 VITALS — BP 144/77 | HR 85 | Temp 97.8°F | Resp 16

## 2024-03-20 DIAGNOSIS — D509 Iron deficiency anemia, unspecified: Secondary | ICD-10-CM | POA: Diagnosis present

## 2024-03-20 DIAGNOSIS — D500 Iron deficiency anemia secondary to blood loss (chronic): Secondary | ICD-10-CM

## 2024-03-20 DIAGNOSIS — Z79899 Other long term (current) drug therapy: Secondary | ICD-10-CM | POA: Diagnosis not present

## 2024-03-20 MED ORDER — IRON SUCROSE 20 MG/ML IV SOLN
200.0000 mg | Freq: Once | INTRAVENOUS | Status: AC
  Administered 2024-03-20: 200 mg via INTRAVENOUS
  Filled 2024-03-20: qty 10

## 2024-03-20 NOTE — Patient Instructions (Signed)

## 2024-03-27 ENCOUNTER — Inpatient Hospital Stay

## 2024-03-27 VITALS — BP 137/76 | HR 71 | Temp 97.3°F | Resp 18

## 2024-03-27 DIAGNOSIS — D500 Iron deficiency anemia secondary to blood loss (chronic): Secondary | ICD-10-CM

## 2024-03-27 DIAGNOSIS — D509 Iron deficiency anemia, unspecified: Secondary | ICD-10-CM | POA: Diagnosis not present

## 2024-03-27 MED ORDER — IRON SUCROSE 20 MG/ML IV SOLN
200.0000 mg | Freq: Once | INTRAVENOUS | Status: AC
  Administered 2024-03-27: 200 mg via INTRAVENOUS

## 2024-03-27 MED ORDER — SODIUM CHLORIDE 0.9% FLUSH
10.0000 mL | Freq: Once | INTRAVENOUS | Status: AC | PRN
  Administered 2024-03-27: 10 mL
  Filled 2024-03-27: qty 10

## 2024-04-03 ENCOUNTER — Inpatient Hospital Stay

## 2024-04-03 VITALS — BP 141/73 | HR 79 | Resp 18

## 2024-04-03 DIAGNOSIS — D500 Iron deficiency anemia secondary to blood loss (chronic): Secondary | ICD-10-CM

## 2024-04-03 DIAGNOSIS — D509 Iron deficiency anemia, unspecified: Secondary | ICD-10-CM | POA: Diagnosis not present

## 2024-04-03 MED ORDER — IRON SUCROSE 20 MG/ML IV SOLN
200.0000 mg | Freq: Once | INTRAVENOUS | Status: AC
  Administered 2024-04-03: 200 mg via INTRAVENOUS
  Filled 2024-04-03: qty 10

## 2024-04-03 NOTE — Patient Instructions (Signed)

## 2024-04-10 ENCOUNTER — Inpatient Hospital Stay

## 2024-04-10 VITALS — BP 128/91 | HR 78 | Temp 97.5°F | Resp 18

## 2024-04-10 DIAGNOSIS — D509 Iron deficiency anemia, unspecified: Secondary | ICD-10-CM | POA: Diagnosis not present

## 2024-04-10 DIAGNOSIS — D500 Iron deficiency anemia secondary to blood loss (chronic): Secondary | ICD-10-CM

## 2024-04-10 MED ORDER — IRON SUCROSE 20 MG/ML IV SOLN
200.0000 mg | Freq: Once | INTRAVENOUS | Status: AC
  Administered 2024-04-10: 200 mg via INTRAVENOUS
  Filled 2024-04-10: qty 10

## 2024-06-12 ENCOUNTER — Inpatient Hospital Stay: Attending: Oncology

## 2024-06-12 DIAGNOSIS — D509 Iron deficiency anemia, unspecified: Secondary | ICD-10-CM | POA: Diagnosis present

## 2024-06-12 DIAGNOSIS — D500 Iron deficiency anemia secondary to blood loss (chronic): Secondary | ICD-10-CM

## 2024-06-12 DIAGNOSIS — K921 Melena: Secondary | ICD-10-CM | POA: Diagnosis not present

## 2024-06-12 DIAGNOSIS — Z79899 Other long term (current) drug therapy: Secondary | ICD-10-CM | POA: Diagnosis not present

## 2024-06-12 LAB — FERRITIN: Ferritin: 8 ng/mL — ABNORMAL LOW (ref 24–336)

## 2024-06-12 LAB — RETIC PANEL
Immature Retic Fract: 11.8 % (ref 2.3–15.9)
RBC.: 4.89 MIL/uL (ref 4.22–5.81)
Retic Count, Absolute: 74.8 K/uL (ref 19.0–186.0)
Retic Ct Pct: 1.5 % (ref 0.4–3.1)
Reticulocyte Hemoglobin: 29.5 pg (ref >27.9)

## 2024-06-12 LAB — CBC WITH DIFFERENTIAL (CANCER CENTER ONLY)
Abs Immature Granulocytes: 0.02 K/uL (ref 0.00–0.07)
Basophils Absolute: 0.1 K/uL (ref 0.0–0.1)
Basophils Relative: 1 %
Eosinophils Absolute: 0.2 K/uL (ref 0.0–0.5)
Eosinophils Relative: 3 %
HCT: 39.8 % (ref 39.0–52.0)
Hemoglobin: 13.2 g/dL (ref 13.0–17.0)
Immature Granulocytes: 0 %
Lymphocytes Relative: 40 %
Lymphs Abs: 2.6 K/uL (ref 0.7–4.0)
MCH: 26.7 pg (ref 26.0–34.0)
MCHC: 33.2 g/dL (ref 30.0–36.0)
MCV: 80.4 fL (ref 80.0–100.0)
Monocytes Absolute: 0.5 K/uL (ref 0.1–1.0)
Monocytes Relative: 7 %
Neutro Abs: 3.1 K/uL (ref 1.7–7.7)
Neutrophils Relative %: 49 %
Platelet Count: 249 K/uL (ref 150–400)
RBC: 4.95 MIL/uL (ref 4.22–5.81)
RDW: 19.2 % — ABNORMAL HIGH (ref 11.5–15.5)
WBC Count: 6.5 K/uL (ref 4.0–10.5)
nRBC: 0 % (ref 0.0–0.2)

## 2024-06-12 LAB — CMP (CANCER CENTER ONLY)
ALT: 35 U/L (ref 0–44)
AST: 36 U/L (ref 15–41)
Albumin: 3.8 g/dL (ref 3.5–5.0)
Alkaline Phosphatase: 98 U/L (ref 38–126)
Anion gap: 8 (ref 5–15)
BUN: 6 mg/dL — ABNORMAL LOW (ref 8–23)
CO2: 22 mmol/L (ref 22–32)
Calcium: 9.2 mg/dL (ref 8.9–10.3)
Chloride: 107 mmol/L (ref 98–111)
Creatinine: 0.71 mg/dL (ref 0.61–1.24)
GFR, Estimated: >60 mL/min (ref >60)
Glucose, Bld: 122 mg/dL — ABNORMAL HIGH (ref 70–99)
Potassium: 3.7 mmol/L (ref 3.5–5.1)
Sodium: 137 mmol/L (ref 135–145)
Total Bilirubin: 0.7 mg/dL (ref 0.0–1.2)
Total Protein: 7.4 g/dL (ref 6.5–8.1)

## 2024-06-12 LAB — IRON AND TIBC
Iron: 37 ug/dL — ABNORMAL LOW (ref 45–182)
Saturation Ratios: 8 % — ABNORMAL LOW (ref 17.9–39.5)
TIBC: 454 ug/dL — ABNORMAL HIGH (ref 250–450)
UIBC: 417 ug/dL

## 2024-06-14 ENCOUNTER — Encounter: Payer: Self-pay | Admitting: Oncology

## 2024-06-14 ENCOUNTER — Inpatient Hospital Stay (HOSPITAL_BASED_OUTPATIENT_CLINIC_OR_DEPARTMENT_OTHER): Admitting: Oncology

## 2024-06-14 ENCOUNTER — Inpatient Hospital Stay

## 2024-06-14 VITALS — BP 124/79 | HR 83 | Temp 96.0°F | Resp 18 | Wt 155.5 lb

## 2024-06-14 DIAGNOSIS — K921 Melena: Secondary | ICD-10-CM | POA: Diagnosis not present

## 2024-06-14 DIAGNOSIS — D509 Iron deficiency anemia, unspecified: Secondary | ICD-10-CM | POA: Diagnosis not present

## 2024-06-14 DIAGNOSIS — D500 Iron deficiency anemia secondary to blood loss (chronic): Secondary | ICD-10-CM | POA: Diagnosis not present

## 2024-06-14 NOTE — Assessment & Plan Note (Addendum)
 Labs are reviewed and discussed with patient. Lab Results  Component Value Date   HGB 13.2 06/12/2024   TIBC 454 (H) 06/12/2024   IRONPCTSAT 8 (L) 06/12/2024   FERRITIN 8 (L) 06/12/2024    Recommend Venofer  weekly x 2.   Etiology of iron  deficiency anemia, suspect GI blood loss.  Refer to GI.

## 2024-06-14 NOTE — Progress Notes (Signed)
 Hematology/Oncology Progress note [phone] Fax:([phone]) [phone]         CHIEF COMPLAINTS/REASON FOR VISIT:  Evaluation of iron  deficiency anemia.    ASSESSMENT & PLAN:   Iron  deficiency anemia Labs are reviewed and discussed with patient. Lab Results  Component Value Date   HGB 13.2 06/12/2024   TIBC 454 (H) 06/12/2024   IRONPCTSAT 8 (L) 06/12/2024   FERRITIN 8 (L) 06/12/2024    Recommend Venofer  weekly x 2.   Etiology of iron  deficiency anemia, suspect GI blood loss.  Refer to GI.  Blood in the stool Refer to gastroenterology for further evaluation.   Orders Placed This Encounter  Procedures   CBC with Differential (Cancer Center Only)    Standing Status:   Future    Expected Date:   10/14/2024    Expiration Date:   01/12/2025   Iron  and TIBC    Standing Status:   Future    Expected Date:   10/14/2024    Expiration Date:   01/12/2025   Ferritin    Standing Status:   Future    Expected Date:   10/14/2024    Expiration Date:   01/12/2025   Retic Panel    Standing Status:   Future    Expected Date:   10/14/2024    Expiration Date:   01/12/2025   Follow-up in 4 months All questions were answered. The patient knows to call the clinic with any problems, questions or concerns.  Zelphia Cap, MD, PhD [facility] Hematology Oncology 06/14/2024   HISTORY OF PRESENTING ILLNESS:   Rodney English is a  68 y.o.  male with PMH listed below was seen in consultation at the request of  Cap Zelphia, MD  for evaluation of iron  deficiency anemia.   He has experienced fatigue and tiredness for over six months. A recent wellness check revealed Hb f 6.4, leading to ER visit and a blood transfusion on Monday. His blood work is consistent with iron  deficiency.  No recent blood in the stool, but he has noticed it in the past. He denies changes in bowel habits, unintentional weight loss, constipation, diarrhea, or stomach pain. His appetite remains good.  He is currently taking  iron  pills and vitamin C  to aid absorption.  His family history is significant for cancer, including a niece who died from colon cancer, a brother who died from pancreatic cancer, and a sister who had breast cancer.  Socially, he drinks beer on weekends, consuming four to five beers, and has a long history of smoking, currently smoking half a pack a day for about fifty-one years.  INTERVAL HISTORY Rodney English is a 68 y.o. male who has above history reviewed by me today presents for follow up visit for iron  deficiency anemia. Patient tolerated IV Venofer  treatments.  Fatigue has slightly improved. Occasionally he noticed blood in the stool.  MEDICAL HISTORY:  Past Medical History:  Diagnosis Date   HTN (hypertension)     SURGICAL HISTORY: History reviewed. No pertinent surgical history.  SOCIAL HISTORY: Social History   Socioeconomic History   Marital status: Single    Spouse name: Not on file   Number of children: Not on file   Years of education: Not on file   Highest education level: Not on file  Occupational History   Not on file  Tobacco Use   Smoking status: Every Day    Current packs/day: 0.50    Average packs/day: 0.5 packs/day for 51.0 years (25.5 ttl pk-yrs)  Types: Cigarettes   Smokeless tobacco: Never  Substance and Sexual Activity   Alcohol use: Yes    Alcohol/week: 5.0 standard drinks of alcohol    Types: 5 Cans of beer per week    Comment: weekends   Drug use: Never   Sexual activity: Not on file  Other Topics Concern   Not on file  Social History Narrative   Not on file   Social Drivers of Health   Financial Resource Strain: Low Risk  (03/14/2024)   Overall Financial Resource Strain (CARDIA)    Difficulty of Paying Living Expenses: Not very hard  Food Insecurity: No Food Insecurity (03/14/2024)   Hunger Vital Sign    Worried About Running Out of Food in the Last Year: Never true    Ran Out of Food in the Last Year: Never true  Transportation  Needs: No Transportation Needs (03/14/2024)   PRAPARE - Administrator, Civil Service (Medical): No    Lack of Transportation (Non-Medical): No  Physical Activity: Not on file  Stress: No Stress Concern Present (03/14/2024)   [employer] of Occupational Health - Occupational Stress Questionnaire    Feeling of Stress: Only a little  Social Connections: Not on file  Intimate Partner Violence: Not At Risk (03/14/2024)   Humiliation, Afraid, Rape, and Kick questionnaire    Fear of Current or Ex-Partner: No    Emotionally Abused: No    Physically Abused: No    Sexually Abused: No    FAMILY HISTORY: Family History  Problem Relation Age of Onset   Breast cancer Sister    Pancreatic cancer Brother    Colon cancer Niece     ALLERGIES:  has no known allergies.  MEDICATIONS:  Current Outpatient Medications  Medication Sig Dispense Refill   acetaminophen (TYLENOL) 500 MG tablet 1 tablet Orally every 6 hrs for 30 days As needed for pain     ascorbic acid (VITAMIN C ) 250 MG tablet Take 250 mg by mouth daily.     atorvastatin (LIPITOR) 40 MG tablet 1 tablet Orally Once a day for 90 days For high cholesterol/plaque build up in arteries     losartan (COZAAR) 25 MG tablet 1 tablet Orally Once a day for 100 days for high blood pressure     pantoprazole (PROTONIX) 40 MG tablet 1 tablet Orally Once a day for 100 days At least 30 minutes before morning meal. For acid reflux.     No current facility-administered medications for this visit.    Review of Systems  Constitutional:  Positive for fatigue. Negative for appetite change, chills, fever and unexpected weight change.  HENT:   Negative for hearing loss and voice change.   Eyes:  Negative for eye problems and icterus.  Respiratory:  Negative for chest tightness, cough and shortness of breath.   Cardiovascular:  Negative for chest pain and leg swelling.  Gastrointestinal:  Negative for abdominal distention and abdominal  pain.  Endocrine: Negative for hot flashes.  Genitourinary:  Negative for difficulty urinating, dysuria and frequency.   Musculoskeletal:  Negative for arthralgias.  Skin:  Negative for itching and rash.  Neurological:  Negative for light-headedness and numbness.  Hematological:  Negative for adenopathy. Does not bruise/bleed easily.  Psychiatric/Behavioral:  Negative for confusion.    PHYSICAL EXAMINATION: ECOG PERFORMANCE STATUS: 1 - Symptomatic but completely ambulatory Vitals:   06/14/24 1003  BP: 124/79  Pulse: 83  Resp: 18  Temp: (!) 96 F (35.6 C)  SpO2: 100%  Filed Weights   06/14/24 1003  Weight: 155 lb 8 oz (70.5 kg)    Physical Exam Constitutional:      General: He is not in acute distress. HENT:     Head: Normocephalic and atraumatic.  Eyes:     General: No scleral icterus. Cardiovascular:     Rate and Rhythm: Normal rate and regular rhythm.     Heart sounds: Normal heart sounds.  Pulmonary:     Effort: Pulmonary effort is normal. No respiratory distress.     Breath sounds: No wheezing.  Abdominal:     General: Bowel sounds are normal. There is no distension.     Palpations: Abdomen is soft.  Musculoskeletal:        General: No deformity. Normal range of motion.     Cervical back: Normal range of motion and neck supple.  Skin:    General: Skin is warm and dry.     Findings: No erythema or rash.  Neurological:     Mental Status: He is alert and oriented to person, place, and time. Mental status is at baseline.     Cranial Nerves: No cranial nerve deficit.     Coordination: Coordination normal.  Psychiatric:        Mood and Affect: Mood normal.     LABORATORY DATA:  I have reviewed the data as listed    Latest Ref Rng & Units 06/12/2024    9:58 AM 03/14/2024    9:08 AM 03/11/2024   10:20 AM  CBC  WBC 4.0 - 10.5 K/uL 6.5  8.6  5.8   Hemoglobin 13.0 - 17.0 g/dL 86.7  8.2  6.4   Hematocrit 39.0 - 52.0 % 39.8  30.7  24.8   Platelets 150 - 400  K/uL 249  338  284       Latest Ref Rng & Units 06/12/2024    9:58 AM 03/11/2024    9:05 AM 01/29/2012    6:27 PM  CMP  Glucose 70 - 99 mg/dL 877  86  88   BUN 8 - 23 mg/dL 6  9  6    Creatinine 0.61 - 1.24 mg/dL 9.28  9.07  9.25   Sodium 135 - 145 mmol/L 137  138  140   Potassium 3.5 - 5.1 mmol/L 3.7  3.8  3.5   Chloride 98 - 111 mmol/L 107  112  106   CO2 22 - 32 mmol/L 22  22  26    Calcium 8.9 - 10.3 mg/dL 9.2  8.6  9.4   Total Protein 6.5 - 8.1 g/dL 7.4     Total Bilirubin 0.0 - 1.2 mg/dL 0.7     Alkaline Phos 38 - 126 U/L 98     AST 15 - 41 U/L 36     ALT 0 - 44 U/L 35         RADIOGRAPHIC STUDIES: I have personally reviewed the radiological images as listed and agreed with the findings in the report. No results found.

## 2024-06-14 NOTE — Assessment & Plan Note (Signed)
Refer to gastroenterology for further evaluation

## 2024-06-18 ENCOUNTER — Telehealth: Payer: Self-pay

## 2024-06-18 NOTE — Telephone Encounter (Signed)
 GI referral faxed to [facility]. Re: IDA  Fax confirmation received.

## 2024-06-26 NOTE — Telephone Encounter (Signed)
 Pt scheduled to see GI on 09/26/24.

## 2024-06-28 ENCOUNTER — Ambulatory Visit

## 2024-07-01 ENCOUNTER — Inpatient Hospital Stay: Attending: Oncology

## 2024-07-01 VITALS — BP 132/82 | HR 77 | Temp 97.0°F | Resp 18

## 2024-07-01 DIAGNOSIS — D509 Iron deficiency anemia, unspecified: Secondary | ICD-10-CM | POA: Diagnosis present

## 2024-07-01 DIAGNOSIS — D500 Iron deficiency anemia secondary to blood loss (chronic): Secondary | ICD-10-CM

## 2024-07-01 MED ORDER — SODIUM CHLORIDE 0.9% FLUSH
10.0000 mL | Freq: Once | INTRAVENOUS | Status: AC | PRN
  Administered 2024-07-01: 10 mL
  Filled 2024-07-01: qty 10

## 2024-07-01 MED ORDER — IRON SUCROSE 20 MG/ML IV SOLN
200.0000 mg | Freq: Once | INTRAVENOUS | Status: AC
  Administered 2024-07-01: 200 mg via INTRAVENOUS
  Filled 2024-07-01: qty 10

## 2024-07-08 ENCOUNTER — Inpatient Hospital Stay

## 2024-07-08 VITALS — BP 135/76 | HR 81 | Temp 97.0°F | Resp 16

## 2024-07-08 DIAGNOSIS — D509 Iron deficiency anemia, unspecified: Secondary | ICD-10-CM | POA: Diagnosis not present

## 2024-07-08 DIAGNOSIS — D500 Iron deficiency anemia secondary to blood loss (chronic): Secondary | ICD-10-CM

## 2024-07-08 MED ORDER — IRON SUCROSE 20 MG/ML IV SOLN
200.0000 mg | Freq: Once | INTRAVENOUS | Status: AC
  Administered 2024-07-08: 200 mg via INTRAVENOUS

## 2024-07-08 MED ORDER — SODIUM CHLORIDE 0.9% FLUSH
10.0000 mL | Freq: Once | INTRAVENOUS | Status: AC | PRN
  Administered 2024-07-08: 10 mL
  Filled 2024-07-08: qty 10

## 2024-10-08 ENCOUNTER — Inpatient Hospital Stay: Attending: Oncology

## 2024-10-09 ENCOUNTER — Telehealth: Payer: Self-pay | Admitting: Oncology

## 2024-10-09 NOTE — Telephone Encounter (Signed)
 Rodney English left a vm regarding the pt missed lab appt on 1/20, I called her back no answer so I left a vm for her to return my call.

## 2024-10-15 ENCOUNTER — Ambulatory Visit: Admitting: Oncology

## 2024-10-15 ENCOUNTER — Ambulatory Visit
